# Patient Record
Sex: Male | Born: 1985 | Hispanic: No | Marital: Single | Smoking: Former smoker
Health system: Southern US, Community
[De-identification: ages and names within clinical notes are randomized; demographics above are authoritative.]

## PROBLEM LIST (undated history)

## (undated) HISTORY — PX: VASECTOMY: SHX75

## (undated) HISTORY — PX: WISDOM TOOTH EXTRACTION: SHX21

---
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
## 2014-08-02 ENCOUNTER — Encounter (HOSPITAL_BASED_OUTPATIENT_CLINIC_OR_DEPARTMENT_OTHER): Payer: Self-pay | Admitting: *Deleted

## 2014-08-02 ENCOUNTER — Emergency Department (HOSPITAL_BASED_OUTPATIENT_CLINIC_OR_DEPARTMENT_OTHER)

## 2014-08-02 ENCOUNTER — Emergency Department (HOSPITAL_BASED_OUTPATIENT_CLINIC_OR_DEPARTMENT_OTHER)
Admission: EM | Admit: 2014-08-02 | Discharge: 2014-08-02 | Disposition: A | Discharge status: Home / Self Care | Attending: Emergency Medicine | Admitting: Emergency Medicine

## 2014-08-02 DIAGNOSIS — R109 Unspecified abdominal pain: Secondary | ICD-10-CM | POA: Insufficient documentation

## 2014-08-02 DIAGNOSIS — R110 Nausea: Secondary | ICD-10-CM | POA: Insufficient documentation

## 2014-08-02 DIAGNOSIS — Z880 Allergy status to penicillin: Secondary | ICD-10-CM | POA: Diagnosis not present

## 2014-08-02 DIAGNOSIS — R197 Diarrhea, unspecified: Secondary | ICD-10-CM | POA: Insufficient documentation

## 2014-08-02 DIAGNOSIS — R509 Fever, unspecified: Secondary | ICD-10-CM | POA: Diagnosis not present

## 2014-08-02 LAB — COMPREHENSIVE METABOLIC PANEL
ALK PHOS: 77 U/L (ref 39–117)
ALT: 19 U/L (ref 0–53)
ANION GAP: 7 (ref 5–15)
AST: 23 U/L (ref 0–37)
Albumin: 4.6 g/dL (ref 3.5–5.2)
BILIRUBIN TOTAL: 1.2 mg/dL (ref 0.3–1.2)
BUN: 14 mg/dL (ref 6–23)
CHLORIDE: 104 mmol/L (ref 96–112)
CO2: 26 mmol/L (ref 19–32)
CREATININE: 0.81 mg/dL (ref 0.50–1.35)
Calcium: 9.2 mg/dL (ref 8.4–10.5)
GFR calc Af Amer: >90 mL/min (ref >90)
GLUCOSE: 110 mg/dL — AB (ref 70–99)
Potassium: 4.1 mmol/L (ref 3.5–5.1)
Sodium: 137 mmol/L (ref 135–145)
Total Protein: 7.6 g/dL (ref 6.0–8.3)

## 2014-08-02 LAB — CBC WITH DIFFERENTIAL/PLATELET
BASOS ABS: 0 10*3/uL (ref 0.0–0.1)
Basophils Relative: 0 % (ref 0–1)
EOS ABS: 0.1 10*3/uL (ref 0.0–0.7)
Eosinophils Relative: 0 % (ref 0–5)
HCT: 46.8 % (ref 39.0–52.0)
Hemoglobin: 16 g/dL (ref 13.0–17.0)
LYMPHS PCT: 5 % — AB (ref 12–46)
Lymphs Abs: 0.7 10*3/uL (ref 0.7–4.0)
MCH: 28.5 pg (ref 26.0–34.0)
MCHC: 34.2 g/dL (ref 30.0–36.0)
MCV: 83.4 fL (ref 78.0–100.0)
Monocytes Absolute: 0.8 10*3/uL (ref 0.1–1.0)
Monocytes Relative: 6 % (ref 3–12)
Neutro Abs: 11.2 10*3/uL — ABNORMAL HIGH (ref 1.7–7.7)
Neutrophils Relative %: 89 % — ABNORMAL HIGH (ref 43–77)
PLATELETS: 330 10*3/uL (ref 150–400)
RBC: 5.61 MIL/uL (ref 4.22–5.81)
RDW: 13.2 % (ref 11.5–15.5)
WBC: 12.8 10*3/uL — ABNORMAL HIGH (ref 4.0–10.5)

## 2014-08-02 LAB — LIPASE, BLOOD: Lipase: 22 U/L (ref 11–59)

## 2014-08-02 MED ORDER — SODIUM CHLORIDE 0.9 % IV SOLN: INTRAVENOUS | Status: DC

## 2014-08-02 MED ORDER — IOHEXOL 300 MG/ML  SOLN
50.0000 mL | Freq: Once | INTRAMUSCULAR | Status: AC | PRN
  Administered 2014-08-02: 50 mL via ORAL

## 2014-08-02 MED ORDER — IOHEXOL 300 MG/ML  SOLN
100.0000 mL | Freq: Once | INTRAMUSCULAR | Status: AC | PRN
  Administered 2014-08-02: 100 mL via INTRAVENOUS

## 2014-08-02 MED ORDER — HYDROCODONE-ACETAMINOPHEN 5-325 MG PO TABS: 1.0000 | ORAL_TABLET | Freq: Four times a day (QID) | ORAL | Status: DC | PRN

## 2014-08-02 MED ORDER — LOPERAMIDE HCL 2 MG PO TABS: 2.0000 mg | ORAL_TABLET | Freq: Four times a day (QID) | ORAL | Status: DC | PRN

## 2014-08-02 MED ORDER — SODIUM CHLORIDE 0.9 % IV BOLUS (SEPSIS)
1000.0000 mL | Freq: Once | INTRAVENOUS | Status: AC
  Administered 2014-08-02: 1000 mL via INTRAVENOUS

## 2014-08-02 MED ORDER — PROMETHAZINE HCL 25 MG PO TABS: 25.0000 mg | ORAL_TABLET | Freq: Four times a day (QID) | ORAL | Status: DC | PRN

## 2014-08-02 MED ORDER — ONDANSETRON HCL 4 MG/2ML IJ SOLN
4.0000 mg | Freq: Once | INTRAMUSCULAR | Status: AC
  Administered 2014-08-02: 4 mg via INTRAVENOUS
  Filled 2014-08-02: qty 2

## 2014-08-02 NOTE — Discharge Instructions (Signed)
CT scan of the abdomen was negative. Take the Phenergan as needed for the nausea and/or vomiting, take the hydrocodone as needed for the abdominal cramps. Take Imodium right ear as needed for the diarrhea. Work note provided. Return for any new or worse symptoms.

## 2014-08-02 NOTE — ED Provider Notes (Signed)
CSN: [id number]     Arrival date & time 08/02/14  1118 History   First MD Initiated Contact with Patient 08/02/14 1151     Chief Complaint  Patient presents with  . Abdominal Pain     (Consider location/radiation/quality/duration/timing/severity/associated sxs/prior Treatment) Patient is a 29 y.o. male presenting with abdominal pain. The history is provided by the patient.  Abdominal Pain Associated symptoms: diarrhea, fever and nausea   Associated symptoms: no chest pain, no dysuria, no shortness of breath and no vomiting    onset 3 and the morning of severe abdominal cramps. Followed by several episodes of diarrhea no blood in the bowel movements. Nausea but no vomiting. Somewhat of a fevers feeling. Pain is worse 8 out of 10. Starting to improve now.  History reviewed. No pertinent past medical history. History reviewed. No pertinent past surgical history. No family history on file. History  Substance Use Topics  . Smoking status: Never Smoker   . Smokeless tobacco: Not on file  . Alcohol Use: Yes     Comment: weekly    Review of Systems  Constitutional: Positive for fever.  HENT: Negative for congestion.   Eyes: Negative for redness.  Respiratory: Negative for shortness of breath.   Cardiovascular: Negative for chest pain.  Gastrointestinal: Positive for nausea, abdominal pain and diarrhea. Negative for vomiting.  Genitourinary: Negative for dysuria.  Musculoskeletal: Negative for back pain.  Skin: Negative for rash.  Neurological: Negative for headaches.  Hematological: Does not bruise/bleed easily.  Psychiatric/Behavioral: Negative for confusion.      Allergies  Amoxicillin  Home Medications   Prior to Admission medications   Medication Sig Start Date End Date Taking? Authorizing Provider  HYDROcodone-acetaminophen (NORCO/VICODIN) 5-325 MG per tablet Take 1-2 tablets by mouth every 6 (six) hours as needed for moderate pain. 08/02/14   Vanetta MuldersScott Jacorie Ernsberger, MD   loperamide (IMODIUM A-D) 2 MG tablet Take 1 tablet (2 mg total) by mouth 4 (four) times daily as needed for diarrhea or loose stools. 08/02/14   Vanetta MuldersScott Keoni Havey, MD  promethazine (PHENERGAN) 25 MG tablet Take 1 tablet (25 mg total) by mouth every 6 (six) hours as needed for nausea or vomiting. 08/02/14   Vanetta MuldersScott Violett Hobbs, MD   BP 125/65 mmHg  Pulse 94  Temp(Src) 98.6 F (37 C) (Oral)  Resp 18  Ht 5\' 9"  (1.753 m)  Wt 160 lb (72.576 kg)  BMI 23.62 kg/m2  SpO2 98% Physical Exam  Constitutional: He is oriented to person, place, and time. He appears well-developed and well-nourished. No distress.  HENT:  Head: Normocephalic and atraumatic.  Mouth/Throat: Oropharynx is clear and moist.  Eyes: Conjunctivae and EOM are normal. Pupils are equal, round, and reactive to light.  Neck: Normal range of motion. Neck supple.  Cardiovascular: Normal rate and regular rhythm.   No murmur heard. Pulmonary/Chest: Effort normal and breath sounds normal. No respiratory distress.  Abdominal: Soft. Bowel sounds are normal. There is no tenderness.  Musculoskeletal: Normal range of motion.  Neurological: He is alert and oriented to person, place, and time. No cranial nerve deficit. He exhibits normal muscle tone. Coordination normal.  Skin: Skin is warm. No rash noted.  Nursing note and vitals reviewed.   ED Course  Procedures (including critical care time) Labs Review Labs Reviewed  CBC WITH DIFFERENTIAL/PLATELET - Abnormal; Notable for the following:    WBC 12.8 (*)    Neutrophils Relative % 89 (*)    Neutro Abs 11.2 (*)    Lymphocytes Relative 5 (*)  All other components within normal limits  COMPREHENSIVE METABOLIC PANEL - Abnormal; Notable for the following:    Glucose, Bld 110 (*)    All other components within normal limits  LIPASE, BLOOD   Results for orders placed or performed during the hospital encounter of 08/02/14  CBC with Differential  Result Value Ref Range   WBC 12.8 (H) 4.0 -  10.5 K/uL   RBC 5.61 4.22 - 5.81 MIL/uL   Hemoglobin 16.0 13.0 - 17.0 g/dL   HCT 16.1 09.6 - 04.5 %   MCV 83.4 78.0 - 100.0 fL   MCH 28.5 26.0 - 34.0 pg   MCHC 34.2 30.0 - 36.0 g/dL   RDW 40.9 81.1 - 91.4 %   Platelets 330 150 - 400 K/uL   Neutrophils Relative % 89 (H) 43 - 77 %   Neutro Abs 11.2 (H) 1.7 - 7.7 K/uL   Lymphocytes Relative 5 (L) 12 - 46 %   Lymphs Abs 0.7 0.7 - 4.0 K/uL   Monocytes Relative 6 3 - 12 %   Monocytes Absolute 0.8 0.1 - 1.0 K/uL   Eosinophils Relative 0 0 - 5 %   Eosinophils Absolute 0.1 0.0 - 0.7 K/uL   Basophils Relative 0 0 - 1 %   Basophils Absolute 0.0 0.0 - 0.1 K/uL  Comprehensive metabolic panel  Result Value Ref Range   Sodium 137 135 - 145 mmol/L   Potassium 4.1 3.5 - 5.1 mmol/L   Chloride 104 96 - 112 mmol/L   CO2 26 19 - 32 mmol/L   Glucose, Bld 110 (H) 70 - 99 mg/dL   BUN 14 6 - 23 mg/dL   Creatinine, Ser 7.82 0.50 - 1.35 mg/dL   Calcium 9.2 8.4 - 95.6 mg/dL   Total Protein 7.6 6.0 - 8.3 g/dL   Albumin 4.6 3.5 - 5.2 g/dL   AST 23 0 - 37 U/L   ALT 19 0 - 53 U/L   Alkaline Phosphatase 77 39 - 117 U/L   Total Bilirubin 1.2 0.3 - 1.2 mg/dL   GFR calc non Af Amer >90 >90 mL/min   GFR calc Af Amer >90 >90 mL/min   Anion gap 7 5 - 15  Lipase, blood  Result Value Ref Range   Lipase 22 11 - 59 U/L     Imaging Review Ct Abdomen Pelvis W Contrast  08/02/2014   CLINICAL DATA:  Abdominal pain, cramping and diarrhea for 12 hours.  EXAM: CT ABDOMEN AND PELVIS WITH CONTRAST  TECHNIQUE: Multidetector CT imaging of the abdomen and pelvis was performed using the standard protocol following bolus administration of intravenous contrast.  CONTRAST:  50 mL OMNIPAQUE IOHEXOL 300 MG/ML SOLN, 100 mL OMNIPAQUE IOHEXOL 300 MG/ML SOLN  COMPARISON:  None.  FINDINGS: The lung bases are clear.  No pleural or pericardial effusion.  The gallbladder, liver, adrenal glands, spleen, pancreas and kidneys appear normal. The stomach, small and large bowel and appendix  appear normal. There is no lymphadenopathy or fluid. No bony abnormality is identified.  IMPRESSION: Negative exam.  No finding to explain the patient's symptoms.   Electronically Signed   By: Drusilla Kanner M.D.   On: 08/02/2014 13:32     EKG Interpretation None      MDM   Final diagnoses:  Abdominal pain    Workup for the severe abdominal cramps negative no signs of bowel obstruction no other abnormalities. Suspect it may be a viral and arises. Some family members had a similar  illness. Patient will be treated symptomatically. Imodium right ear for the diarrhea Phenergan for the nausea has not been any vomiting, and hydrocodone for the abdominal cramps. Overall patient improved here. No significant lab abnormalities.    Vanetta Mulders, MD 08/02/14 1356

## 2014-08-02 NOTE — ED Notes (Signed)
MD at bedside. 

## 2014-08-02 NOTE — ED Notes (Signed)
Woke at 3am with abdominal cramps and diarrhea.

## 2015-12-18 ENCOUNTER — Emergency Department (HOSPITAL_BASED_OUTPATIENT_CLINIC_OR_DEPARTMENT_OTHER)
Admission: EM | Admit: 2015-12-18 | Discharge: 2015-12-18 | Disposition: A | Discharge status: Home / Self Care | Attending: Emergency Medicine | Admitting: Emergency Medicine

## 2015-12-18 ENCOUNTER — Encounter (HOSPITAL_BASED_OUTPATIENT_CLINIC_OR_DEPARTMENT_OTHER): Payer: Self-pay | Admitting: Emergency Medicine

## 2015-12-18 DIAGNOSIS — F17210 Nicotine dependence, cigarettes, uncomplicated: Secondary | ICD-10-CM | POA: Insufficient documentation

## 2015-12-18 DIAGNOSIS — R011 Cardiac murmur, unspecified: Secondary | ICD-10-CM | POA: Diagnosis present

## 2015-12-18 NOTE — ED Triage Notes (Signed)
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 Patient does not have PCP. Denies pain or concerns at this time.

## 2015-12-18 NOTE — ED Provider Notes (Signed)
  [facility]   CSN: [id number] Arrival date & time: 12/18/15  1323     History   Chief Complaint Chief Complaint  Patient presents with  . Follow-up    sent by military MD    HPI Luberta Robertsonhomas Nickell is a 30 y.o. male.  The history is provided by the patient.  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 Reportedly found a new systolic murmur. Patient was off complaints. No chest pain. No lightheadedness or dizziness. Otherwise healthy. He did not know that he had a murmur.  History reviewed. No pertinent past medical history.  There are no active problems to display for this patient.   Past Surgical History:  Procedure Laterality Date  . VASECTOMY    . WISDOM TOOTH EXTRACTION         Home Medications    Prior to Admission medications   Not on File    Family History History reviewed. No pertinent family history.  Social History Social History  Substance Use Topics  . Smoking status: Light Tobacco Smoker    Types: Cigarettes  . Smokeless tobacco: Not on file  . Alcohol use Yes     Comment: weekly     Allergies   Amoxicillin   Review of Systems Review of Systems  Constitutional: Negative for appetite change.  Respiratory: Negative for chest tightness and shortness of breath.   Cardiovascular: Negative for chest pain.  Gastrointestinal: Negative for abdominal pain.  Genitourinary: Negative for flank pain.  Musculoskeletal: Negative for back pain.  Neurological: Negative for light-headedness.     Physical Exam Updated Vital Signs BP 129/76 (BP Location: Left Arm)   Pulse 65   Temp 98.4 F (36.9 C) (Oral)   Resp 16   Ht 5\' 10"  (1.778 m)   Wt 163 lb 11.2 oz (74.3 kg)   SpO2 100%   BMI 23.49 kg/m   Physical Exam  Constitutional: He appears well-developed.  HENT:  Head: Atraumatic.  Eyes: EOM are normal.  Neck: No JVD present.  Cardiovascular: Normal rate and regular rhythm.   Patient  appears to have a mild systolic murmur.  Pulmonary/Chest: Effort normal.  Abdominal: Soft.     ED Treatments / Results  Labs (all labs ordered are listed, but only abnormal results are displayed) Labs Reviewed - No data to display  EKG  EKG Interpretation None       Radiology No results found.  Procedures Procedures (including critical care time)  Medications Ordered in ED Medications - No data to display   Initial Impression / Assessment and Plan / ED Course  I have reviewed the triage vital signs and the nursing notes.  Pertinent labs & imaging results that were available during my care of the patient were reviewed by me and considered in my medical decision making (see chart for details).  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 He was given resources to follow-up with cardiology. Will discharge.  Final Clinical Impressions(s) / ED Diagnoses   Final diagnoses:  Heart murmur    New Prescriptions Current Discharge Medication List       Benjiman CoreNathan Ashlyn Cabler, MD 12/18/15 1420

## 2015-12-18 NOTE — ED Notes (Signed)
Dr. Rubin PayorPickering from room stating pt does not need an EKG. Order to be cancelled.

## 2016-02-11 IMAGING — CT CT ABD-PELV W/ CM
2 of 4 series · 17 of 46 positions shown, 19 images · IV contrast (APPLIED)
Comparison: None.

CLINICAL DATA: Abdominal pain, cramping and diarrhea for 12 hours.

EXAM:
CT ABDOMEN AND PELVIS WITH CONTRAST
TECHNIQUE: Multidetector CT imaging of the abdomen and pelvis was performed
using the standard protocol following bolus administration of
intravenous contrast.
CONTRAST:  50 mL OMNIPAQUE IOHEXOL 300 MG/ML SOLN, 100 mL OMNIPAQUE
IOHEXOL 300 MG/ML SOLN

[Series 2: abd/pelvis 5.0 b31f · axial · 0.68mm/px · z∈[+734,+1149]mm · 14 of 91 slices shown, 16 images]
[im 4/91  soft-tissue]
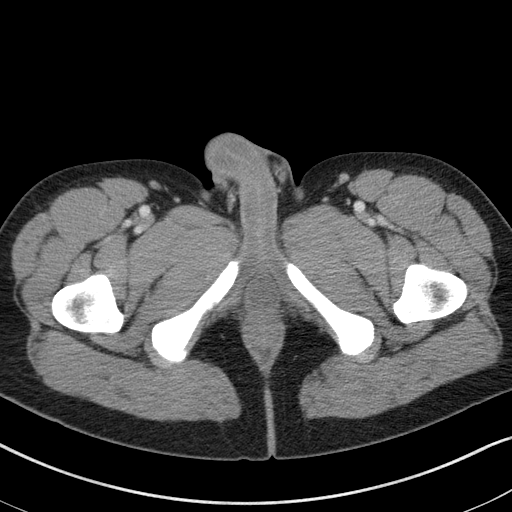
[im 4/91  bone]
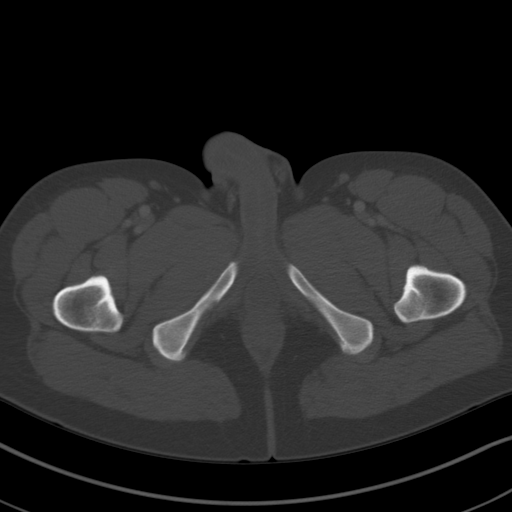
[im 12/91  soft-tissue]
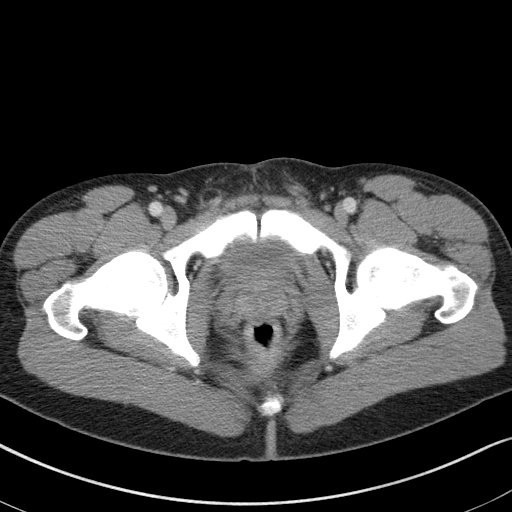
[im 19/91  soft-tissue]
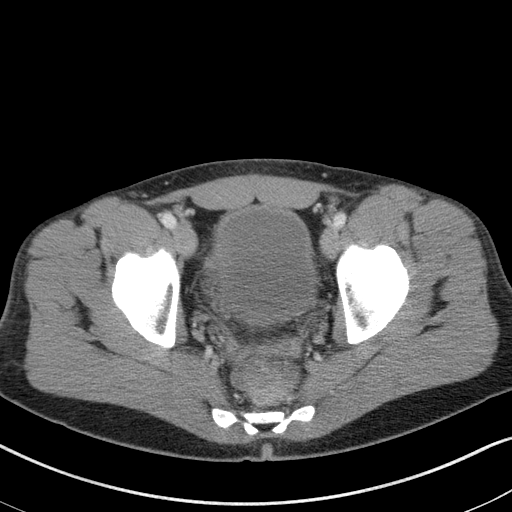
[im 23/91  soft-tissue]
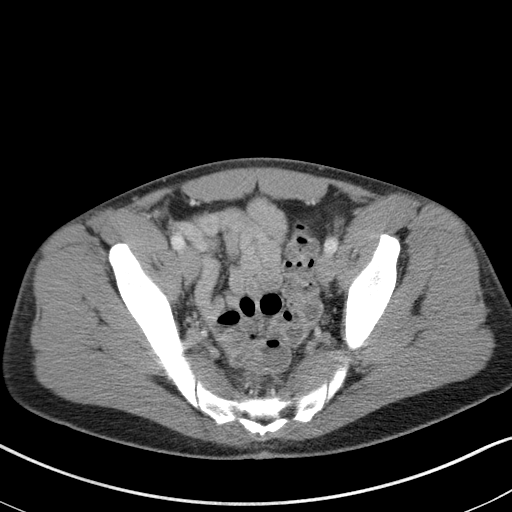
[im 31/91  soft-tissue]
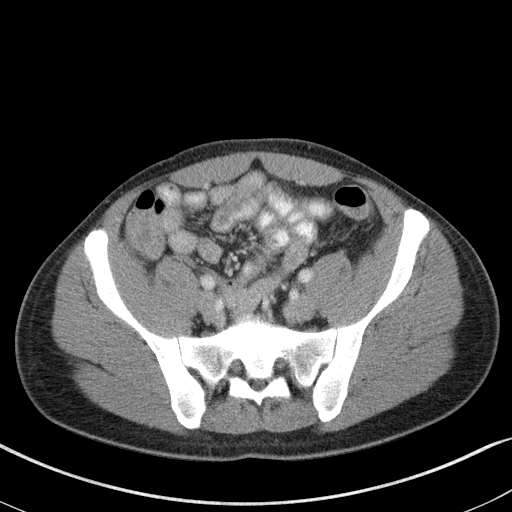
[im 38/91  soft-tissue]
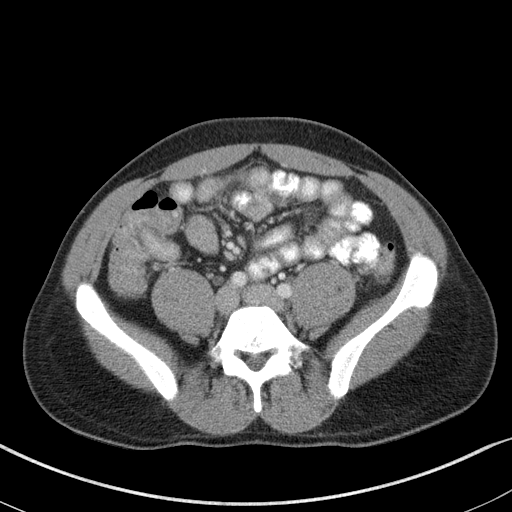
[im 42/91  soft-tissue]
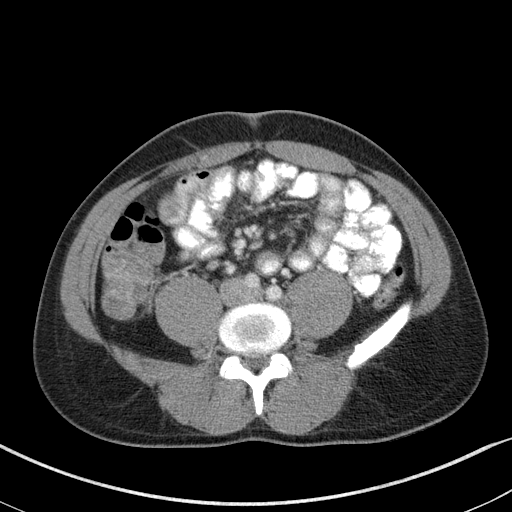
[im 49/91  soft-tissue]
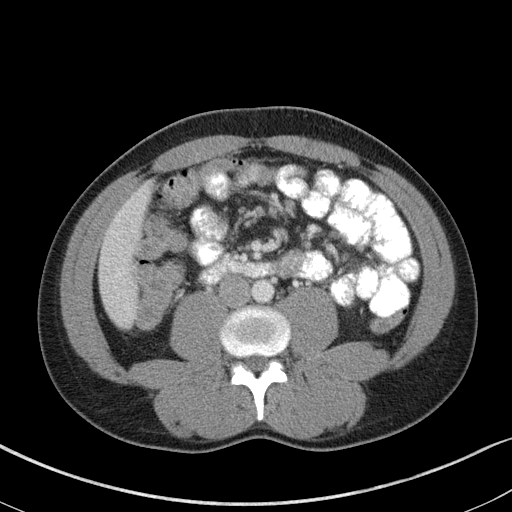
[im 53/91  soft-tissue]
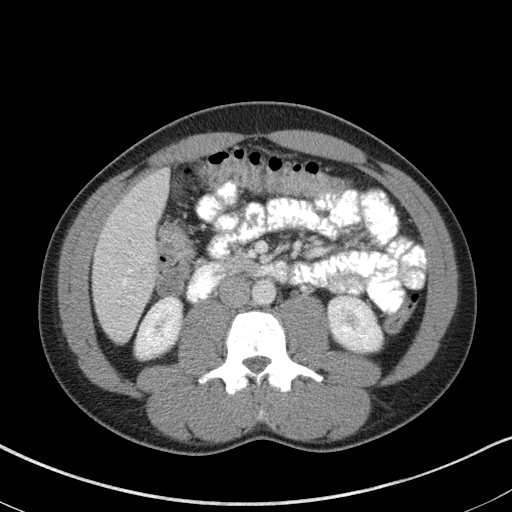
[im 53/91  bone]
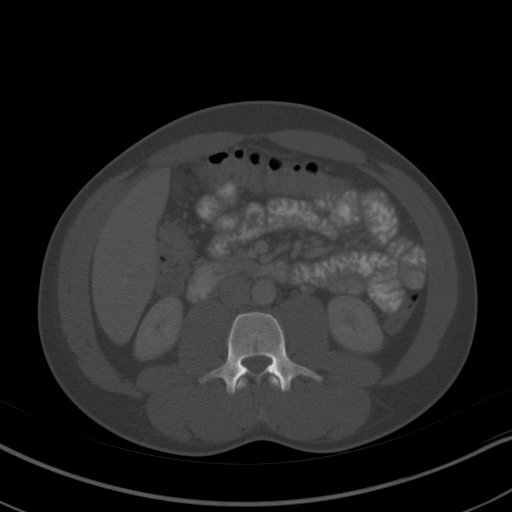
[im 61/91  soft-tissue]
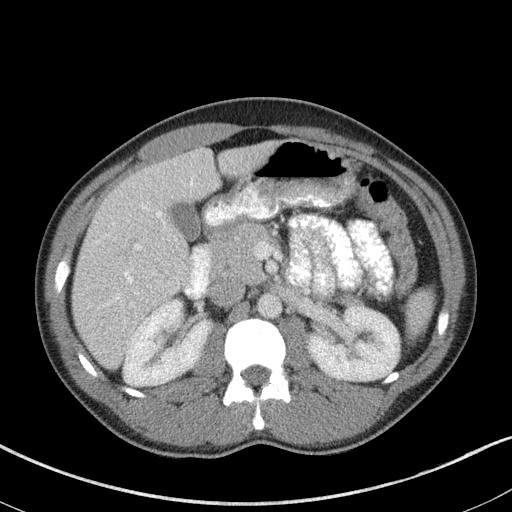
[im 68/91  soft-tissue]
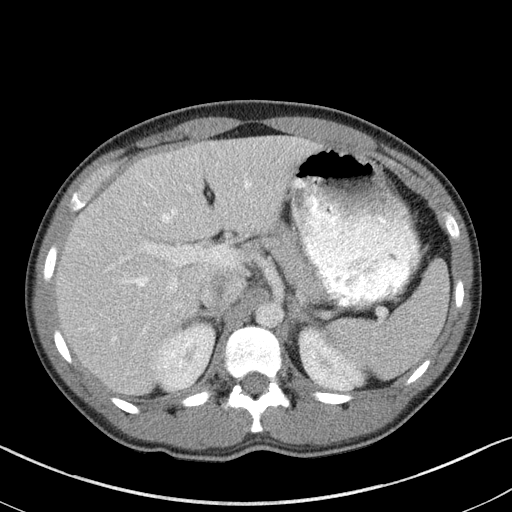
[im 72/91  soft-tissue]
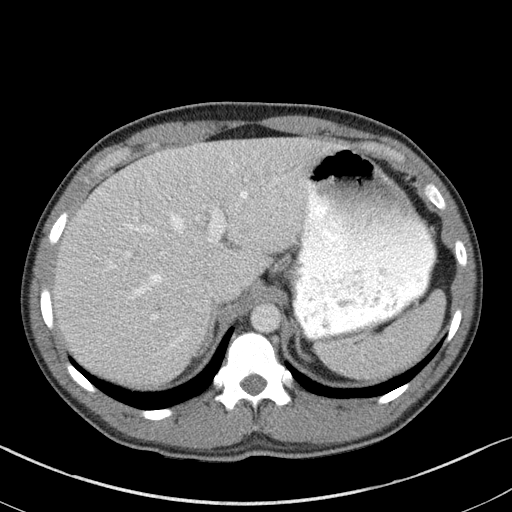
[im 79/91  soft-tissue]
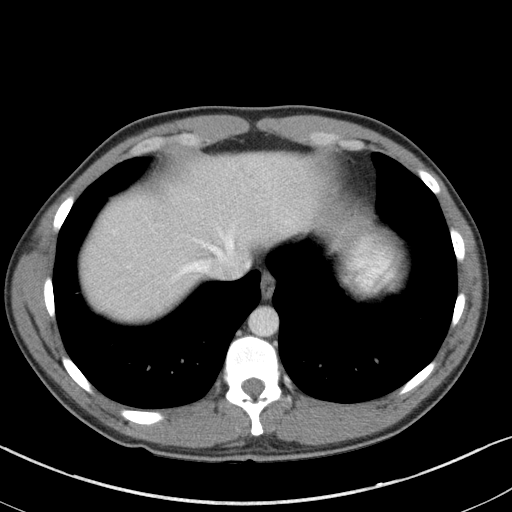
[im 87/91  soft-tissue]
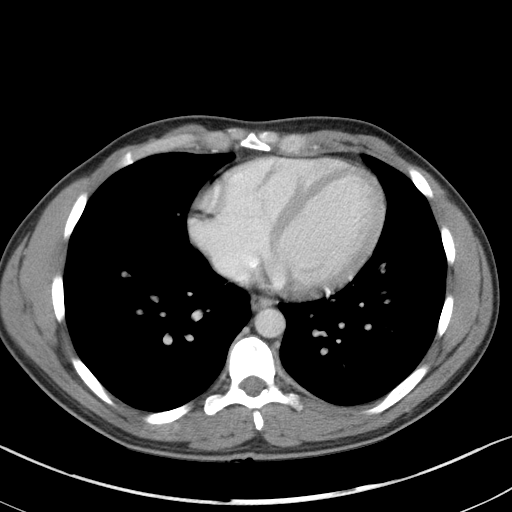

[Series 5: abd/pelvis 3.0 coronal · coronal · 0.74mm/px · 3 of 83 slices shown]
[im 28/83  soft-tissue]
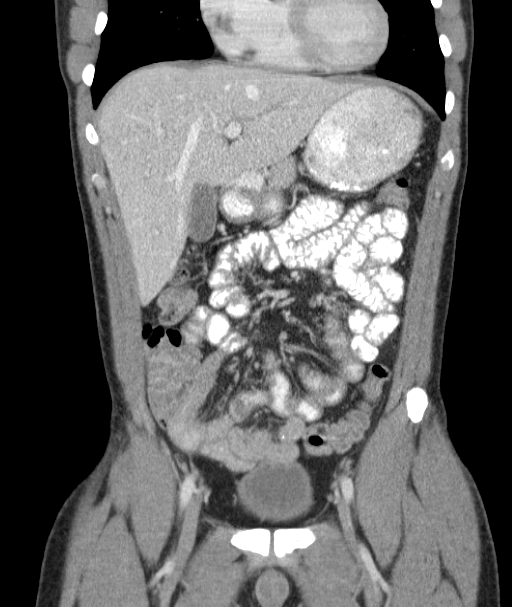
[im 37/83  soft-tissue]
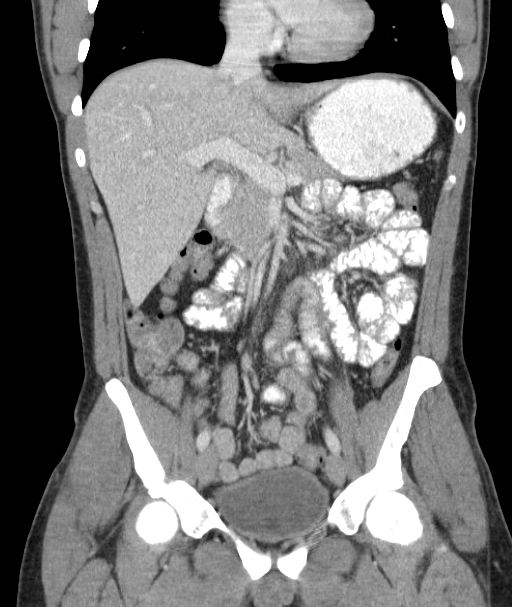
[im 46/83  soft-tissue]
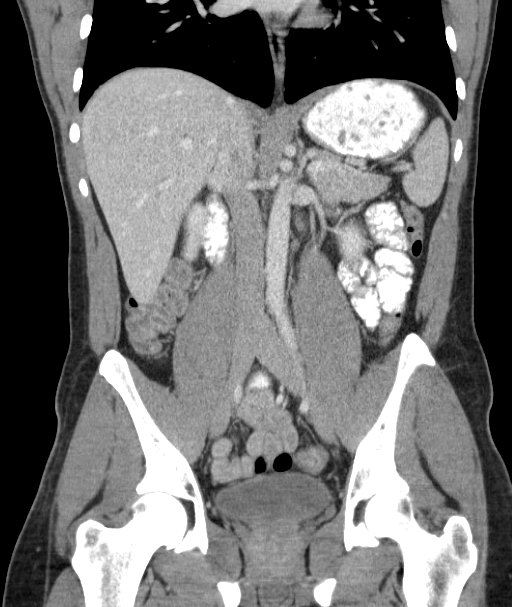

[17 of 46 positions shown; findings below may reference images not displayed]

FINDINGS: The lung bases are clear.  No pleural or pericardial effusion.

The gallbladder, liver, adrenal glands, spleen, pancreas and kidneys
appear normal. The stomach, small and large bowel and appendix
appear normal. There is no lymphadenopathy or fluid. No bony
abnormality is identified.
IMPRESSION: Negative exam.  No finding to explain the patient's symptoms.

## 2019-02-25 ENCOUNTER — Emergency Department (HOSPITAL_BASED_OUTPATIENT_CLINIC_OR_DEPARTMENT_OTHER)
Admission: EM | Admit: 2019-02-25 | Discharge: 2019-02-25 | Disposition: A | Discharge status: Home / Self Care | Attending: Emergency Medicine | Admitting: Emergency Medicine

## 2019-02-25 ENCOUNTER — Encounter (HOSPITAL_BASED_OUTPATIENT_CLINIC_OR_DEPARTMENT_OTHER): Payer: Self-pay

## 2019-02-25 ENCOUNTER — Other Ambulatory Visit: Payer: Self-pay

## 2019-02-25 DIAGNOSIS — S81811A Laceration without foreign body, right lower leg, initial encounter: Secondary | ICD-10-CM

## 2019-02-25 DIAGNOSIS — Z23 Encounter for immunization: Secondary | ICD-10-CM | POA: Diagnosis not present

## 2019-02-25 DIAGNOSIS — Z880 Allergy status to penicillin: Secondary | ICD-10-CM | POA: Diagnosis not present

## 2019-02-25 DIAGNOSIS — Y929 Unspecified place or not applicable: Secondary | ICD-10-CM | POA: Insufficient documentation

## 2019-02-25 DIAGNOSIS — Y999 Unspecified external cause status: Secondary | ICD-10-CM | POA: Diagnosis not present

## 2019-02-25 DIAGNOSIS — Y9389 Activity, other specified: Secondary | ICD-10-CM | POA: Diagnosis not present

## 2019-02-25 DIAGNOSIS — S71111A Laceration without foreign body, right thigh, initial encounter: Secondary | ICD-10-CM | POA: Diagnosis present

## 2019-02-25 DIAGNOSIS — W260XXA Contact with knife, initial encounter: Secondary | ICD-10-CM | POA: Insufficient documentation

## 2019-02-25 DIAGNOSIS — Z87891 Personal history of nicotine dependence: Secondary | ICD-10-CM | POA: Insufficient documentation

## 2019-02-25 MED ORDER — TETANUS-DIPHTH-ACELL PERTUSSIS 5-2.5-18.5 LF-MCG/0.5 IM SUSP
0.5000 mL | Freq: Once | INTRAMUSCULAR | Status: AC
  Administered 2019-02-25: 0.5 mL via INTRAMUSCULAR
  Filled 2019-02-25: qty 0.5

## 2019-02-25 MED ORDER — LIDOCAINE-EPINEPHRINE (PF) 2 %-1:200000 IJ SOLN
10.0000 mL | Freq: Once | INTRAMUSCULAR | Status: AC
  Administered 2019-02-25: 10 mL
  Filled 2019-02-25: qty 10

## 2019-02-25 NOTE — ED Triage Notes (Addendum)
Pt states he cut right anterior thigh with a boxcutter while cutting a box ~30 min PTA-NAD-steady gait with own crutch x 1

## 2019-02-25 NOTE — ED Provider Notes (Signed)
[facility] EMERGENCY DEPARTMENT Provider Note   CSN: [id number] Arrival date & time: 02/25/19  1432     History   Chief Complaint Chief Complaint  Patient presents with  . Laceration    HPI Marquett Bertoli is a 33 y.o. male with no relevant past medical history who presents to the ED with laceration.  Patient reports that he was using a box cutter on a project when he managed to slice his right thigh.  Patient is able to ambulate without difficulty.  He denies any numbness or tingling.  He is unsure of his tetanus status.  He denies any fevers or chills.      HPI  History reviewed. No pertinent past medical history.  There are no active problems to display for this patient.   Past Surgical History:  Procedure Laterality Date  . VASECTOMY    . WISDOM TOOTH EXTRACTION          Home Medications    Prior to Admission medications   Not on File    Family History No family history on file.  Social History Social History   Tobacco Use  . Smoking status: Former Smoker    Types: Cigarettes  . Smokeless tobacco: Never Used  Substance Use Topics  . Alcohol use: Yes    Comment: occ  . Drug use: No     Allergies   Amoxicillin   Review of Systems Review of Systems  Constitutional: Negative for fever.  Skin: Positive for wound.  Neurological: Negative for numbness.     Physical Exam Updated Vital Signs BP 121/71 (BP Location: Right Arm)   Pulse (!) 53   Temp 98.6 F (37 C) (Oral)   Resp 16   Ht 5\' 9"  (1.753 m)   Wt 72.1 kg   SpO2 100%   BMI 23.48 kg/m   Physical Exam Vitals signs and nursing note reviewed. Exam conducted with a chaperone present.  Constitutional:      Appearance: Normal appearance.  HENT:     Head: Normocephalic and atraumatic.  Eyes:     General: No scleral icterus.    Conjunctiva/sclera: Conjunctivae normal.  Cardiovascular:     Rate and Rhythm: Normal rate and regular rhythm.  Pulmonary:     Effort:  Pulmonary effort is normal.  Musculoskeletal:     Comments: Right thigh: 1.25 cm vertical, linear laceration.  Not well approximated.  Hemostasis relatively well controlled. Right hip: ROM, strength, sensation intact. Right knee: ROM, strength, sensation intact. Right ankle: ROM, strength, sensation, pulses, and distal cap refill intact.  Skin:    General: Skin is dry.  Neurological:     Mental Status: He is alert.     GCS: GCS eye subscore is 4. GCS verbal subscore is 5. GCS motor subscore is 6.  Psychiatric:        Mood and Affect: Mood normal.        Behavior: Behavior normal.        Thought Content: Thought content normal.      ED Treatments / Results  Labs (all labs ordered are listed, but only abnormal results are displayed) Labs Reviewed - No data to display  EKG None  Radiology No results found.  Procedures . Laceration Repair  Date/Time: 02/25/2019 4:25 PM Performed by: 02/27/2019, PA-C Authorized by: Lorelee New, PA-C   Consent:    Consent obtained:  Verbal   Consent given by:  Patient   Risks discussed:  Infection, need for  additional repair, pain, poor cosmetic result, poor wound healing, tendon damage, vascular damage, nerve damage and retained foreign body   Alternatives discussed:  No treatment and delayed treatment Universal protocol:    Procedure explained and questions answered to patient or proxy's satisfaction: yes     Relevant documents present and verified: yes     Test results available and properly labeled: yes     Imaging studies available: yes     Required blood products, implants, devices, and special equipment available: yes     Site/side marked: yes     Immediately prior to procedure, a time out was called: yes     Patient identity confirmed:  Verbally with patient Anesthesia (see MAR for exact dosages):    Anesthesia method:  Local infiltration   Local anesthetic:  Lidocaine 1% WITH epi Laceration details:    Location:   Leg   Leg location:  R upper leg   Length (cm):  1.3 Repair type:    Repair type:  Simple Pre-procedure details:    Preparation:  Patient was prepped and draped in usual sterile fashion Exploration:    Hemostasis achieved with:  Epinephrine and direct pressure   Wound exploration: wound explored through full range of motion   Treatment:    Area cleansed with:  Saline   Amount of cleaning:  Extensive   Irrigation solution:  Sterile saline   Irrigation volume:  250 mL   Irrigation method:  Pressure wash Skin repair:    Repair method:  Sutures   Suture size:  3-0   Suture material:  Prolene   Number of sutures:  3 Post-procedure details:    Dressing:  Open (no dressing)   (including critical care time)  Medications Ordered in ED Medications  lidocaine-EPINEPHrine (XYLOCAINE W/EPI) 2 %-1:200000 (PF) injection 10 mL (10 mLs Infiltration Given 02/25/19 1553)  Tdap (BOOSTRIX) injection 0.5 mL (0.5 mLs Intramuscular Given 02/25/19 1552)     Initial Impression / Assessment and Plan / ED Course  I have reviewed the triage vital signs and the nursing notes.  Pertinent labs & imaging results that were available during my care of the patient were reviewed by me and considered in my medical decision making (see chart for details).        Patient had a laceration repaired with three 3-0 Prolene sutures.  Wound was irrigated with 250 mL sterile saline.  No numbness or tingling and patient is neurovascularly intact.  Patient was given Boostrix to do uncertain tetanus status.  Before and after the procedure the motor function, strength, sensation, and circulatory function was assessed.  After the procedure patient had intact motor function with 5/5 strength (equal to unaffected side) to all joints of the right leg with the only sensory changes attributable to the local anesthetic.  The procedure was well-tolerated and all questions were answered.  Patient understands return precautions  and wound care instructions.   Final Clinical Impressions(s) / ED Diagnoses   Final diagnoses:  Laceration of right lower extremity, initial encounter    ED Discharge Orders    None       Corena Herter, PA-C 02/25/19 1635    Fredia Sorrow, MD 03/01/19 [phone]

## 2019-02-25 NOTE — Discharge Instructions (Addendum)
Please read the attachments and wound care.  Please return to the ED, family practitioner, or an urgent care for suture removal in 10 to 14 days.  Steri-Strips will fall off spontaneously.  Ibuprofen or Tylenol as needed for pain control.  Antibiotics are unnecessary but please keep area clean.  Please return to the ED or seek medical attention should you develop any surrounding redness, swelling, fevers or chills, or purulent discharge.

## 2019-02-25 NOTE — ED Notes (Signed)
ED Provider at bedside. 

## 2019-02-25 NOTE — ED Notes (Signed)
ED Provider at bedside suturing right leg
